# Patient Record
Sex: Male | Born: 1980 | Race: White | Hispanic: No | Marital: Married | Smoking: Never smoker
Health system: Southern US, Community
[De-identification: ages and names within clinical notes are randomized; demographics above are authoritative.]

---

## 2011-07-19 ENCOUNTER — Ambulatory Visit (INDEPENDENT_AMBULATORY_CARE_PROVIDER_SITE_OTHER): Payer: BC Managed Care – PPO

## 2011-07-19 DIAGNOSIS — R071 Chest pain on breathing: Secondary | ICD-10-CM

## 2011-07-19 DIAGNOSIS — S5010XA Contusion of unspecified forearm, initial encounter: Secondary | ICD-10-CM

## 2011-07-19 DIAGNOSIS — S5000XA Contusion of unspecified elbow, initial encounter: Secondary | ICD-10-CM

## 2012-07-15 ENCOUNTER — Emergency Department (HOSPITAL_COMMUNITY)
Admission: EM | Admit: 2012-07-15 | Discharge: 2012-07-15 | Discharge status: Against Medical Advice | Payer: BC Managed Care – PPO | Attending: Emergency Medicine | Admitting: Emergency Medicine

## 2012-07-15 ENCOUNTER — Ambulatory Visit (INDEPENDENT_AMBULATORY_CARE_PROVIDER_SITE_OTHER): Payer: BC Managed Care – PPO | Admitting: Internal Medicine

## 2012-07-15 VITALS — BP 133/80 | HR 62 | Temp 98.6°F | Resp 16 | Ht 72.0 in | Wt 166.0 lb

## 2012-07-15 DIAGNOSIS — M79673 Pain in unspecified foot: Secondary | ICD-10-CM

## 2012-07-15 DIAGNOSIS — M79609 Pain in unspecified limb: Secondary | ICD-10-CM

## 2012-07-15 DIAGNOSIS — Z5320 Procedure and treatment not carried out because of patient's decision for unspecified reasons: Secondary | ICD-10-CM | POA: Insufficient documentation

## 2012-07-15 NOTE — Progress Notes (Signed)
  Subjective:    Patient ID: Ross Nelson, male    DOB: 12/31/1980, 32 y.o.   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

## 2014-07-03 ENCOUNTER — Emergency Department (HOSPITAL_COMMUNITY)
Admission: EM | Admit: 2014-07-03 | Discharge: 2014-07-03 | Disposition: A | Discharge status: Home / Self Care | Payer: 59 | Source: Home / Self Care | Attending: Emergency Medicine | Admitting: Emergency Medicine

## 2014-07-03 ENCOUNTER — Encounter (HOSPITAL_COMMUNITY): Payer: Self-pay | Admitting: *Deleted

## 2014-07-03 DIAGNOSIS — S060X0A Concussion without loss of consciousness, initial encounter: Secondary | ICD-10-CM

## 2014-07-03 NOTE — ED Provider Notes (Signed)
CSN: [id number]     Arrival date & time 07/03/14  1019 History   First MD Initiated Contact with Patient 07/03/14 1050     No chief complaint on file.  (Consider location/radiation/quality/duration/timing/severity/associated sxs/prior Treatment) HPI  He is a healthy 34 year old man here for evaluation of head injury. He states he was running yesterday morning at 6:30 when he slipped on some ice and hit the back of his head on the ground. He denies any loss of consciousness, and was able to get up and finish his 10 mile run. Today, he states he has had a moderate headache as well as some mild nausea and mild dizziness. He also describes feeling a little off, with his head feeling "swimmy."  No vomiting or loss of balance.  He denies any history of concussions or head injuries.  No past medical history on file. No past surgical history on file. Family History  Problem Relation Age of Onset  . Hypertension Mother   . Sleep apnea Brother   . Obesity Brother    History  Substance Use Topics  . Smoking status: Never Smoker   . Smokeless tobacco: Not on file  . Alcohol Use: Yes    Review of Systems  Constitutional: Negative.   Gastrointestinal: Positive for nausea. Negative for vomiting.  Neurological: Positive for dizziness and headaches.  Psychiatric/Behavioral: Positive for decreased concentration.    Allergies  Review of patient's allergies indicates no known allergies.  Home Medications   Prior to Admission medications   Not on File   BP 131/73 mmHg  Pulse 50  Temp(Src) 98.4 F (36.9 C) (Oral)  Resp 14  SpO2 100% Physical Exam  Constitutional: He is oriented to person, place, and time. He appears well-developed and well-nourished. No distress.  HENT:  Head: Normocephalic.  Eyes: Conjunctivae and EOM are normal. Pupils are equal, round, and reactive to light.  Neck: Neck supple.  Cardiovascular: Normal rate.   Pulmonary/Chest: Effort normal.  Neurological: He is alert  and oriented to person, place, and time. He displays normal reflexes. No cranial nerve deficit. He exhibits normal muscle tone. Coordination normal.  Romberg negative  Psychiatric: He has a normal mood and affect. Thought content normal.    ED Course  Procedures (including critical care time) Labs Review Labs Reviewed - No data to display  Imaging Review No results found.   MDM   1. Concussion, without loss of consciousness, initial encounter    He likely has a mild concussion. Discussed okay to take Tylenol or Motrin for headache. Recommended rest until symptoms resolve. Once symptoms have resolved, he can gradually return to physical and mental activity. If he develops vomiting, severe dizziness, imbalance he will go to the emergency room.    Charm RingsErin J Betzaida Cremeens, MD 07/03/14 [phone]

## 2014-07-03 NOTE — Discharge Instructions (Signed)
You likely have a mild concussion. You can take Tylenol or ibuprofen for the headache.  Take it easy until your symptoms resolve. Once her symptoms have resolved gradually returned to both mental and physical activity. If the symptoms recur, it means you're trying to do too much too soon.  If you develop vomiting, dizziness, difficulty balancing please go to the emergency room.

## 2014-07-03 NOTE — ED Notes (Signed)
Pt  Reports  Symptoms     Of       Having  Lisette GrinderFallen   Yesterday        Striking  The  Back    Of  His  Head     He  Did  Not black  Out   -    He  Has  Not  Vomited      He is  Awake  And  Alert  And  His  PEARLA      HE  REPORTS  A  MILD  HEADACHE

## 2015-03-17 ENCOUNTER — Other Ambulatory Visit: Payer: Self-pay | Admitting: Family Medicine

## 2015-03-17 DIAGNOSIS — M79671 Pain in right foot: Secondary | ICD-10-CM

## 2015-03-18 ENCOUNTER — Other Ambulatory Visit: Payer: Self-pay | Admitting: Family Medicine

## 2015-03-18 ENCOUNTER — Ambulatory Visit
Admission: RE | Admit: 2015-03-18 | Discharge: 2015-03-18 | Disposition: A | Discharge status: Home / Self Care | Payer: 59 | Source: Ambulatory Visit | Attending: Family Medicine | Admitting: Family Medicine

## 2015-03-18 DIAGNOSIS — M79671 Pain in right foot: Secondary | ICD-10-CM

## 2015-03-20 ENCOUNTER — Other Ambulatory Visit: Payer: 59

## 2015-03-21 ENCOUNTER — Ambulatory Visit
Admission: RE | Admit: 2015-03-21 | Discharge: 2015-03-21 | Disposition: A | Discharge status: Home / Self Care | Payer: 59 | Source: Ambulatory Visit | Attending: Family Medicine | Admitting: Family Medicine

## 2015-03-21 DIAGNOSIS — M79671 Pain in right foot: Secondary | ICD-10-CM

## 2016-05-02 ENCOUNTER — Other Ambulatory Visit: Payer: Self-pay | Admitting: Family Medicine

## 2016-05-02 DIAGNOSIS — M25551 Pain in right hip: Secondary | ICD-10-CM

## 2016-05-07 ENCOUNTER — Inpatient Hospital Stay: Admission: RE | Admit: 2016-05-07 | Payer: Self-pay | Source: Ambulatory Visit

## 2016-05-10 ENCOUNTER — Other Ambulatory Visit: Payer: Self-pay | Admitting: Family Medicine

## 2016-05-10 ENCOUNTER — Ambulatory Visit
Admission: RE | Admit: 2016-05-10 | Discharge: 2016-05-10 | Disposition: A | Discharge status: Home / Self Care | Payer: BLUE CROSS/BLUE SHIELD | Source: Ambulatory Visit | Attending: Family Medicine | Admitting: Family Medicine

## 2016-05-10 DIAGNOSIS — M25551 Pain in right hip: Secondary | ICD-10-CM

## 2016-05-18 ENCOUNTER — Other Ambulatory Visit: Payer: BLUE CROSS/BLUE SHIELD

## 2016-05-26 ENCOUNTER — Ambulatory Visit
Admission: RE | Admit: 2016-05-26 | Discharge: 2016-05-26 | Disposition: A | Discharge status: Home / Self Care | Payer: BLUE CROSS/BLUE SHIELD | Source: Ambulatory Visit | Attending: Family Medicine | Admitting: Family Medicine

## 2016-05-26 DIAGNOSIS — M25551 Pain in right hip: Secondary | ICD-10-CM

## 2017-06-17 IMAGING — MR MR HIP*R* W/O CM
4 of 5 series · 16 of 40 positions shown · non-contrast
Comparison: 05/10/2016

CLINICAL DATA: New right hip pain extending into the right upper
thigh. Running athlete.

EXAM:
MR OF THE RIGHT HIP WITHOUT CONTRAST
TECHNIQUE: Multiplanar, multisequence MR imaging was performed. No intravenous
contrast was administered.

[Series 3: T1 · coronal · 4.0mm · 0.49mm/px · 3 of 34 slices shown (1 of 2)]
[im 5/34]
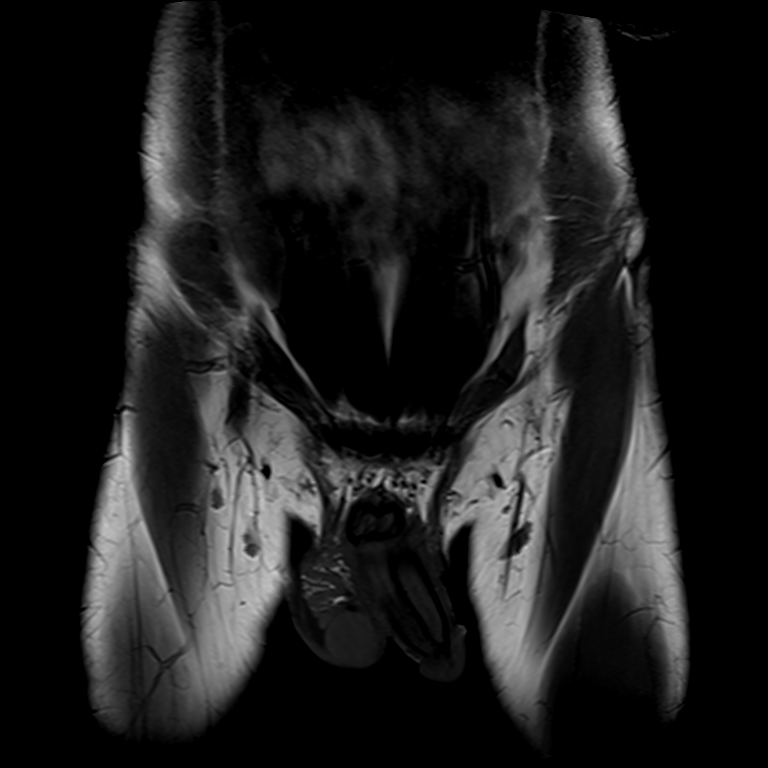
[im 17/34]
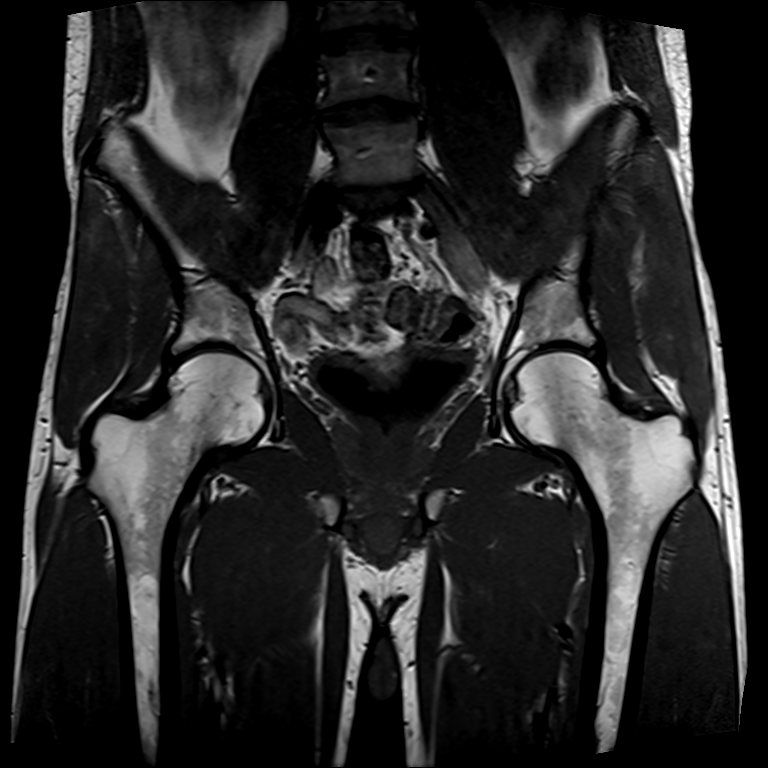
[im 29/34]
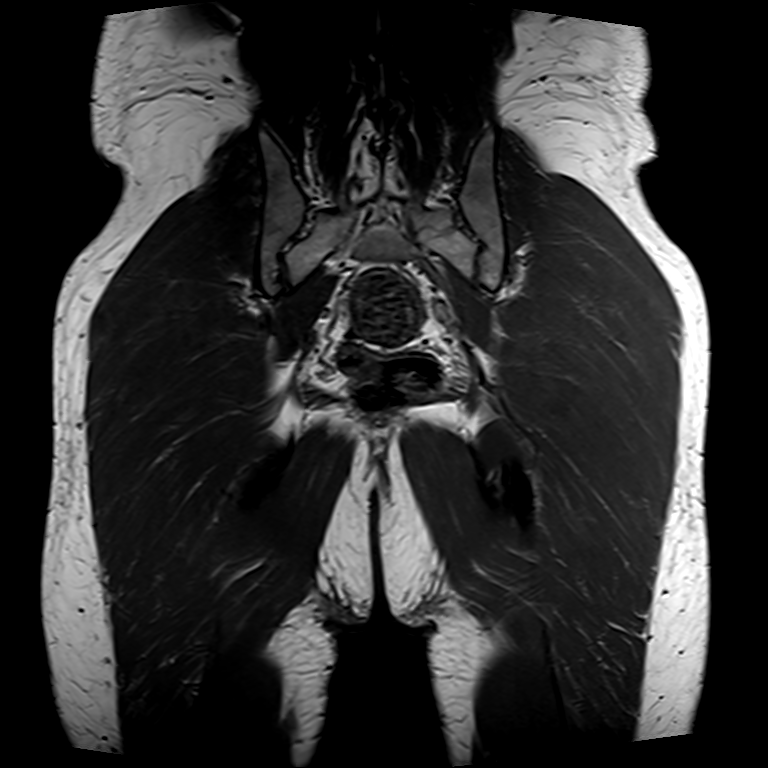

[Series 4: T2 fat-sat · axial · 4.0mm · 0.52mm/px · z∈[-66,+30]mm · 3 of 28 slices shown]
[im 4/28]
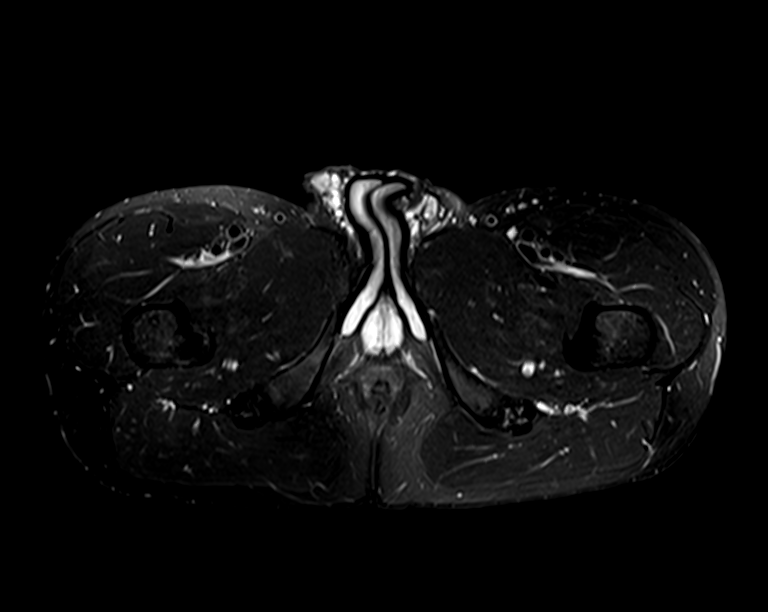
[im 16/28]
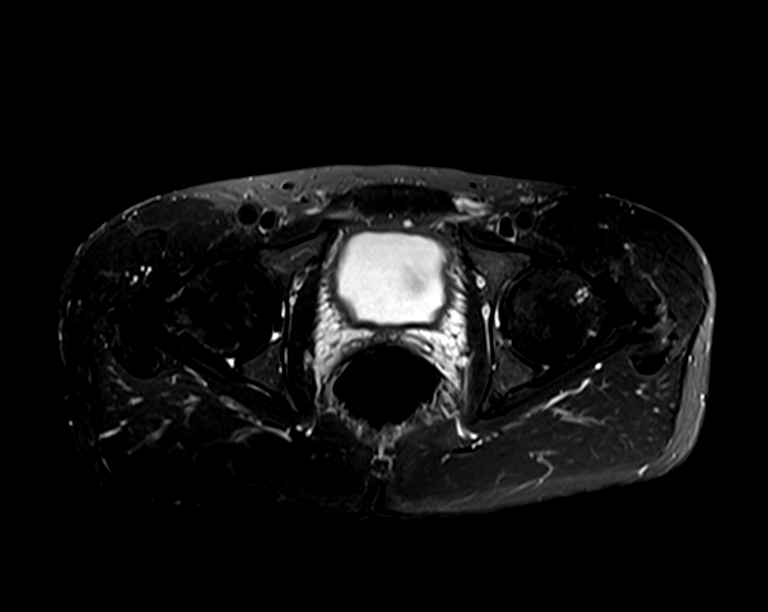
[im 24/28]
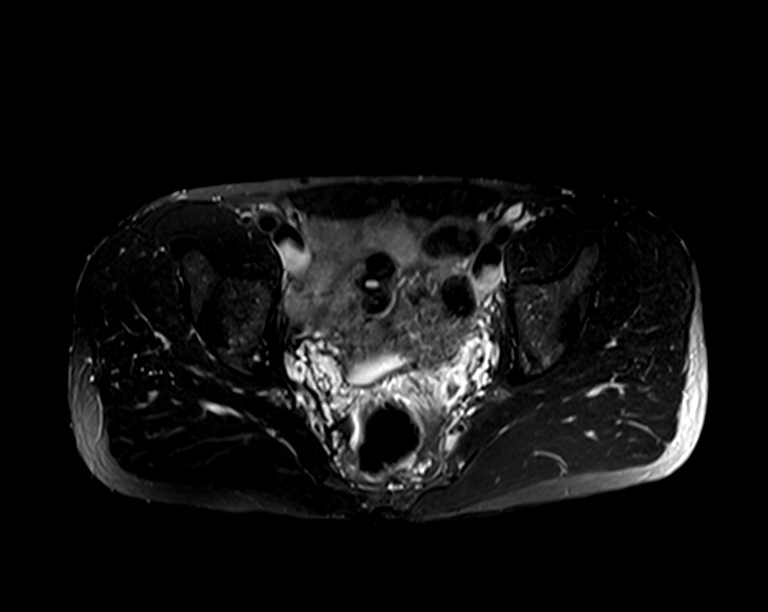

[Series 5: T1 · axial · 4.0mm · 0.49mm/px · z∈[-54,+23]mm · 3 of 25 slices shown (2 of 2)]
[im 5/25]
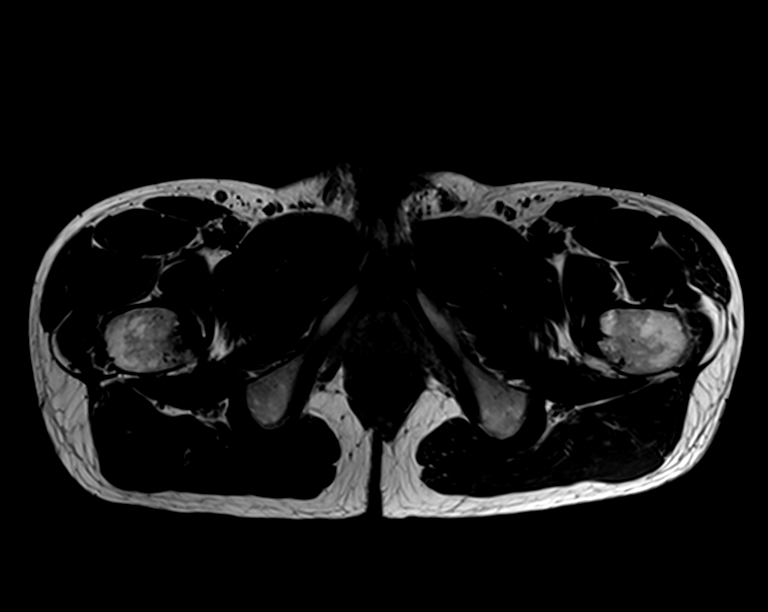
[im 13/25]
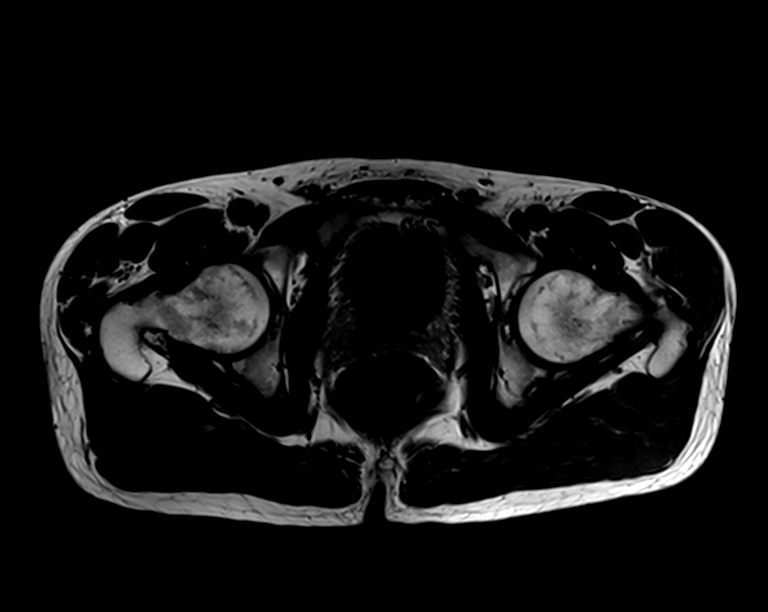
[im 21/25]
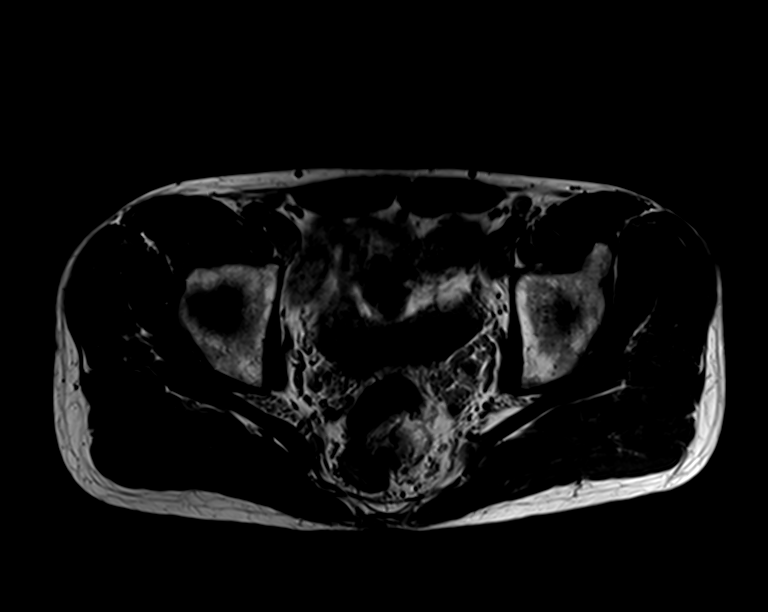

[Series 6: PD · sagittal · 4.0mm · 0.61mm/px · 7 of 24 slices shown]
[im 1/24]
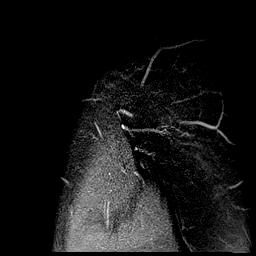
[im 4/24]
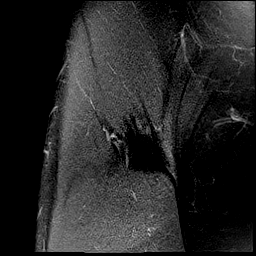
[im 8/24]
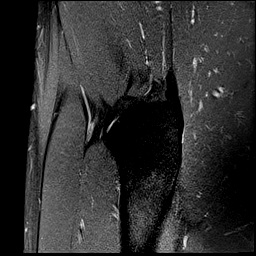
[im 12/24]
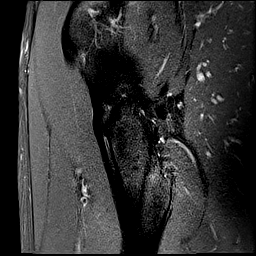
[im 16/24]
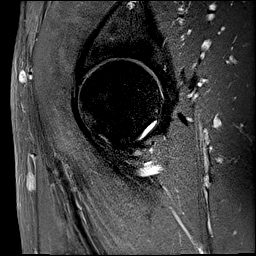
[im 20/24]
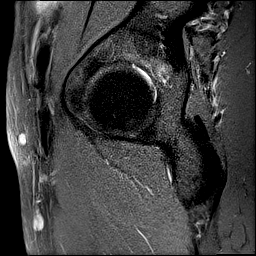
[im 24/24]
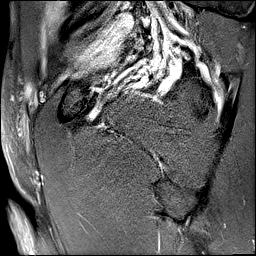

[16 of 40 positions shown; findings below may reference images not displayed]

FINDINGS: Bones: Tiny synovial herniation pit in the left proximal femur
anteriorly. No significant marrow edema in the proximal femurs or
visualized bony pelvis.

Articular cartilage and labrum

Articular cartilage:  Unremarkable

Labrum:  Grossly unremarkable

Joint or bursal effusion

Joint effusion:  Absent

Bursae:  No regional bursitis

Muscles and tendons

Muscles and tendons:  Unremarkable

Other findings

Miscellaneous:  No supplemental non-categorized findings.
IMPRESSION: 1. No specific internal derangement to explain the patient's current
symptoms.

## 2018-05-18 ENCOUNTER — Ambulatory Visit
Admission: RE | Admit: 2018-05-18 | Discharge: 2018-05-18 | Disposition: A | Discharge status: Home / Self Care | Payer: BLUE CROSS/BLUE SHIELD | Source: Ambulatory Visit | Attending: Family Medicine | Admitting: Family Medicine

## 2018-05-18 ENCOUNTER — Other Ambulatory Visit: Payer: Self-pay | Admitting: Family Medicine

## 2018-05-18 DIAGNOSIS — M79674 Pain in right toe(s): Secondary | ICD-10-CM

## 2019-02-11 ENCOUNTER — Other Ambulatory Visit: Payer: Self-pay

## 2019-02-11 DIAGNOSIS — Z20822 Contact with and (suspected) exposure to covid-19: Secondary | ICD-10-CM

## 2019-02-13 LAB — SPECIMEN STATUS REPORT

## 2019-02-13 LAB — NOVEL CORONAVIRUS, NAA: SARS-CoV-2, NAA: NOT DETECTED

## 2019-07-09 ENCOUNTER — Ambulatory Visit: Payer: BC Managed Care – PPO | Attending: Internal Medicine

## 2019-07-09 DIAGNOSIS — Z20822 Contact with and (suspected) exposure to covid-19: Secondary | ICD-10-CM

## 2019-07-10 LAB — NOVEL CORONAVIRUS, NAA: SARS-CoV-2, NAA: NOT DETECTED

## 2020-05-13 ENCOUNTER — Other Ambulatory Visit: Payer: Self-pay | Admitting: Physician Assistant

## 2020-05-13 DIAGNOSIS — R221 Localized swelling, mass and lump, neck: Secondary | ICD-10-CM

## 2020-05-14 ENCOUNTER — Ambulatory Visit
Admission: RE | Admit: 2020-05-14 | Discharge: 2020-05-14 | Disposition: A | Discharge status: Home / Self Care | Payer: BC Managed Care – PPO | Source: Ambulatory Visit | Attending: Physician Assistant | Admitting: Physician Assistant

## 2020-05-14 DIAGNOSIS — R221 Localized swelling, mass and lump, neck: Secondary | ICD-10-CM
